# Patient Record
Sex: Female | Born: 2005
Health system: Southern US, Community
[De-identification: ages and names within clinical notes are randomized; demographics above are authoritative.]

## PROBLEM LIST (undated history)

## (undated) DIAGNOSIS — L309 Dermatitis, unspecified: Secondary | ICD-10-CM

## (undated) DIAGNOSIS — J302 Other seasonal allergic rhinitis: Secondary | ICD-10-CM

## (undated) DIAGNOSIS — R011 Cardiac murmur, unspecified: Secondary | ICD-10-CM

## (undated) DIAGNOSIS — E739 Lactose intolerance, unspecified: Secondary | ICD-10-CM

## (undated) DIAGNOSIS — M419 Scoliosis, unspecified: Secondary | ICD-10-CM

## (undated) DIAGNOSIS — Q059 Spina bifida, unspecified: Secondary | ICD-10-CM

## (undated) DIAGNOSIS — F419 Anxiety disorder, unspecified: Secondary | ICD-10-CM

## (undated) DIAGNOSIS — F4310 Post-traumatic stress disorder, unspecified: Secondary | ICD-10-CM

## (undated) DIAGNOSIS — F32A Depression, unspecified: Secondary | ICD-10-CM

## (undated) HISTORY — DX: Dermatitis, unspecified: L30.9

## (undated) HISTORY — DX: Scoliosis, unspecified: M41.9

## (undated) HISTORY — DX: Spina bifida, unspecified: Q05.9

---
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
## 2024-01-09 ENCOUNTER — Other Ambulatory Visit: Payer: Self-pay | Admitting: Obstetrics & Gynecology

## 2024-01-09 DIAGNOSIS — N946 Dysmenorrhea, unspecified: Secondary | ICD-10-CM

## 2024-01-10 NOTE — Telephone Encounter (Signed)
 Med refill request: Catherine Wright Last AEX: 02/16/23 Dr. Lavoie Next AEX: none scheduled Last MMG (if hormonal med) n/a Refill authorized: Catherine Wright, needs appointment. Please approve or deny as appropriate.

## 2024-04-01 ENCOUNTER — Other Ambulatory Visit: Payer: Self-pay | Admitting: Radiology

## 2024-04-01 DIAGNOSIS — N946 Dysmenorrhea, unspecified: Secondary | ICD-10-CM

## 2024-04-02 NOTE — Telephone Encounter (Signed)
.  Med refill request: Catherine Wright  3-0.02 MG Last AEX: 02/16/23 Next AEX: Not scheduled sent the front a message to schedule an appt.  Last MMG (if hormonal med) NA Refill authorized: Please Advise?

## 2024-05-18 ENCOUNTER — Other Ambulatory Visit: Payer: Self-pay

## 2024-05-18 DIAGNOSIS — N946 Dysmenorrhea, unspecified: Secondary | ICD-10-CM

## 2024-05-21 MED ORDER — DROSPIRENONE-ETHINYL ESTRADIOL 3-0.02 MG PO TABS
1.0000 | ORAL_TABLET | Freq: Every day | ORAL | 0 refills | Status: AC
Start: 2024-05-21 — End: ?

## 2024-05-21 NOTE — Telephone Encounter (Signed)
.  Med refill request:Catherine Wright  Last AEX: 02/16/23 Next AEX: Not scheduled sent a message to the front  Last MMG (if hormonal med) Refill authorized: Please Advise?  Pt states that she is completely out of the medication.

## 2024-06-25 ENCOUNTER — Encounter (HOSPITAL_COMMUNITY): Payer: Self-pay | Admitting: *Deleted

## 2024-06-25 ENCOUNTER — Ambulatory Visit (HOSPITAL_COMMUNITY)
Admission: EM | Admit: 2024-06-25 | Discharge: 2024-06-25 | Disposition: A | Discharge status: Home / Self Care | Attending: Physician Assistant | Admitting: Physician Assistant

## 2024-06-25 DIAGNOSIS — J069 Acute upper respiratory infection, unspecified: Secondary | ICD-10-CM | POA: Diagnosis not present

## 2024-06-25 LAB — POC COVID19/FLU A&B COMBO
Covid Antigen, POC: NEGATIVE
Influenza A Antigen, POC: NEGATIVE
Influenza B Antigen, POC: NEGATIVE

## 2024-06-25 LAB — POCT RAPID STREP A (OFFICE): Rapid Strep A Screen: NEGATIVE

## 2024-06-25 NOTE — ED Provider Notes (Signed)
 [facility]    CSN: [id number] Arrival date & time: 06/25/24  1754      History   Chief Complaint Chief Complaint  Patient presents with   Sore Throat   Fever   Cough   Nasal Congestion    HPI Catherine Wright is a 18 y.o. female.   HPI  Pt is here for concerns of dry cough, sore throat, headache, fever, fatigue   Her mother is here and is helping with HPI She states the patient recently started working 2 jobs and this makes her tired. However since yesterday she felt feverish, sore throat, coughing, fatigue.  She also has had nasal congestion, rhinorrhea, headache and pain behind the eyes She reports single episode of loose stool yesterday. She has been able to drink water and tea today but has not eaten anything since yesterday due to decreased appetite  Tmax was 101.6 around 8 AM. She did not take any OTC medications today  Around noon temp was 100.8. Around 5;15  it was 99.8   Past Medical History:  Diagnosis Date   Anxiety    Depression    Eczema    Heart murmur    Lactose intolerance    PTSD (post-traumatic stress disorder)    Scoliosis    Seasonal allergies    Spina bifida [facility])     Patient Active Problem List   Diagnosis Date Noted   Periumbilical pain 01/16/2015   Spina bifida (HCC) 11/28/2014   Slow transit constipation 11/28/2014   Anxiety 11/28/2014   Sprain of right ankle 07/03/2014   Mitral valve prolapse 02/07/2014    History reviewed. No pertinent surgical history.  OB History     Gravida  0   Para  0   Term  0   Preterm  0   AB  0   Living  0      SAB  0   IAB  0   Ectopic  0   Multiple  0   Live Births  0            Home Medications    Prior to Admission medications   Medication Sig Start Date End Date Taking? Authorizing Provider  Cetirizine HCl (ZYRTEC ALLERGY) 10 MG CAPS Take by mouth.   Yes [provider]  drospirenone -ethinyl estradiol  (NIKKI ) 3-0.02 MG tablet Take 1 tablet by  mouth daily. 05/21/24  Yes Chrzanowski, Jami B, NP  sertraline  (ZOLOFT ) 50 MG tablet Take 1 tablet (50 mg total) by mouth daily. 05/31/22  Yes Mozingo, Regina Nattalie, NP  Ascorbic Acid (VITAMIN C PO) Take by mouth. 11/20/14   [provider]  Multiple Vitamins-Minerals (ZINC PO) Take by mouth.    [provider]  VITAMIN D PO Take by mouth.    [provider]    Family History Family History  Problem Relation Age of Onset   Thyroid disease Mother    Celiac disease Mother    Hypertension Maternal Grandmother    Kidney disease Maternal Grandmother    Pancreatic cancer Maternal Grandfather    Heart disease Paternal Grandfather    Prostate cancer Other        mggf   Stroke Other        mggf   Parkinson's disease Other        mggf    Social History Social History   Tobacco Use   Smoking status: Never   Smokeless tobacco: Never  Vaping Use   Vaping status:  Never Used  Substance Use Topics   Alcohol use: Never   Drug use: Never     Allergies   Gramineae pollens, Lactose intolerance (gi), and Other   Review of Systems Review of Systems  Constitutional:  Positive for chills, fatigue and fever.  HENT:  Positive for congestion, ear pain, rhinorrhea and sore throat.   Respiratory:  Positive for cough. Negative for shortness of breath and wheezing.   Gastrointestinal:  Positive for diarrhea. Negative for nausea and vomiting.  Musculoskeletal:  Positive for myalgias.  Skin:  Negative for rash.  Neurological:  Positive for headaches.     Physical Exam Triage Vital Signs ED Triage Vitals  Encounter Vitals Group     BP 06/25/24 1914 105/66     Girls Systolic BP Percentile --      Girls Diastolic BP Percentile --      Boys Systolic BP Percentile --      Boys Diastolic BP Percentile --      Pulse Rate 06/25/24 1914 (!) 101     Resp 06/25/24 1914 18     Temp 06/25/24 1914 99.4 F (37.4 C)     Temp Source 06/25/24 1914 Oral     SpO2 06/25/24  1914 97 %     Weight --      Height --      Head Circumference --      Peak Flow --      Pain Score 06/25/24 1912 10     Pain Loc --      Pain Education --      Exclude from Growth Chart --    No data found.  Updated Vital Signs BP 105/66 (BP Location: Left Arm)   Pulse (!) 101   Temp 99.4 F (37.4 C) (Oral)   Resp 18   LMP 06/13/2024 (Approximate)   SpO2 97%   Visual Acuity Right Eye Distance:   Left Eye Distance:   Bilateral Distance:    Right Eye Near:   Left Eye Near:    Bilateral Near:     Physical Exam Vitals reviewed.  Constitutional:      General: She is awake.     Appearance: Normal appearance. She is well-developed and well-groomed.  HENT:     Head: Normocephalic and atraumatic.     Right Ear: Hearing, tympanic membrane and ear canal normal.     Left Ear: Hearing, tympanic membrane and ear canal normal.     Mouth/Throat:     Lips: Pink.     Mouth: Mucous membranes are moist.     Pharynx: Oropharynx is clear. Uvula midline. No pharyngeal swelling, oropharyngeal exudate, posterior oropharyngeal erythema, uvula swelling or postnasal drip.  Cardiovascular:     Rate and Rhythm: Normal rate and regular rhythm.     Pulses: Normal pulses.          Radial pulses are 2+ on the right side and 2+ on the left side.     Heart sounds: Normal heart sounds. No murmur heard.    No friction rub. No gallop.  Pulmonary:     Effort: Pulmonary effort is normal.     Breath sounds: Normal breath sounds. No decreased air movement. No decreased breath sounds, wheezing, rhonchi or rales.  Musculoskeletal:     Cervical back: Normal range of motion and neck supple.  Lymphadenopathy:     Head:     Right side of head: No submental, submandibular or preauricular adenopathy.     Left  side of head: No submental, submandibular or preauricular adenopathy.     Cervical:     Right cervical: No superficial cervical adenopathy.    Left cervical: No superficial cervical adenopathy.      Upper Body:     Right upper body: No supraclavicular adenopathy.     Left upper body: No supraclavicular adenopathy.  Neurological:     General: No focal deficit present.     Mental Status: She is alert and oriented to person, place, and time.  Psychiatric:        Mood and Affect: Mood normal.        Behavior: Behavior normal. Behavior is cooperative.      UC Treatments / Results  Labs (all labs ordered are listed, but only abnormal results are displayed) Labs Reviewed  POCT RAPID STREP A (OFFICE) - Normal  POC COVID19/FLU A&B COMBO    EKG   Radiology No results found.  Procedures Procedures (including critical care time)  Medications Ordered in UC Medications - No data to display  Initial Impression / Assessment and Plan / UC Course  I have reviewed the triage vital signs and the nursing notes.  Pertinent labs & imaging results that were available during my care of the patient were reviewed by me and considered in my medical decision making (see chart for details).      Final Clinical Impressions(s) / UC Diagnoses   Final diagnoses:  Viral upper respiratory tract infection   Visit with patient indicates symptoms comprised of sore throat, fatigue, fever, chills, cough since yesterday congruent with acute URI that is likely viral in nature  Patient has tested negative for COVID, flu and strep in clinic today.  Due to nature and duration of symptoms recommended treatment regimen is symptomatic relief and follow up if needed Discussed with patient the various viral and bacterial etiologies of current illness and appropriate course of treatment Discussed OTC medication options for multisymptom relief such as Dayquil/Nyquil, Theraflu, AlkaSeltzer, etc. Discussed return precautions if symptoms are not improving or worsen over next 5-7 days.     Discharge Instructions      Based on your described symptoms and the duration of symptoms it is likely that you have a  viral upper respiratory infection (often called a cold)  Symptoms can last for 3-10 days with lingering cough and intermittent symptoms potentially  lasting several  weeks after that.  The goal of treatment at this time is to reduce your symptoms and discomfort   You can use the following medications and measures to help yourself feel better until your body fights this off: DayQuil/NyQuil, TheraFlu, Alka-Seltzer  (these medications typically have the same active ingredients in them so you can choose whichever one you prefer and take consistently during the day and night according to the manufactures instructions.) Flonase A daily antihistamine such as Zyrtec, Claritin, Allegra per your preference.  Please choose 1 and take consistently. Increased fluids.  It is recommended that you take in at least 64 ounces of water per day when you are not sick so it is important to increase this when you are sick and your body may be running fever. Rest Cough drops Chloraseptic throat spray to help with sore throat Nasal saline spray or nasal flushes to help with congestion and runny nose  If your symptoms seem like they are getting worse over the next 5 to 7 days or not improving you can always follow-up here in urgent care or go to your  primary care provider for further management. Go to the ER if you begin to have more serious symptoms such as shortness of breath, trouble breathing, loss of consciousness, swelling around the eyes, high fever, severe lasting headaches, vision changes or neck pain/stiffness.       ED Prescriptions   None    PDMP not reviewed this encounter.   Marylene Rocky FORBES DEVONNA 06/25/24 2119

## 2024-06-25 NOTE — Discharge Instructions (Addendum)

## 2024-06-25 NOTE — ED Triage Notes (Signed)
 Pts mom states she had a cough a while, yesterday she developed headache, fever, (101 today), sore throat. Pt states she took IBU yesterday non meds today. She also states she has diarrhea yesterday,
